# Patient Record
Sex: Male | Born: 1970 | Race: Black or African American | Hispanic: No | Marital: Single | Smoking: Current every day smoker
Health system: Southern US, Community
[De-identification: ages and names within clinical notes are randomized; demographics above are authoritative.]

## PROBLEM LIST (undated history)

## (undated) HISTORY — PX: APPENDECTOMY: SHX54

---

## 1998-11-09 ENCOUNTER — Emergency Department (HOSPITAL_COMMUNITY): Admission: EM | Admit: 1998-11-09 | Discharge: 1998-11-09 | Payer: Self-pay | Admitting: Emergency Medicine

## 1999-11-21 ENCOUNTER — Emergency Department (HOSPITAL_COMMUNITY): Admission: EM | Admit: 1999-11-21 | Discharge: 1999-11-21 | Payer: Self-pay | Admitting: *Deleted

## 2006-10-04 ENCOUNTER — Emergency Department (HOSPITAL_COMMUNITY): Admission: EM | Admit: 2006-10-04 | Discharge: 2006-10-04 | Payer: Self-pay | Admitting: Emergency Medicine

## 2009-04-10 ENCOUNTER — Emergency Department (HOSPITAL_COMMUNITY): Admission: EM | Admit: 2009-04-10 | Discharge: 2009-04-10 | Payer: Self-pay | Admitting: Family Medicine

## 2009-11-24 ENCOUNTER — Emergency Department (HOSPITAL_COMMUNITY): Admission: EM | Admit: 2009-11-24 | Discharge: 2009-11-24 | Payer: Self-pay | Admitting: Emergency Medicine

## 2010-08-03 LAB — GC/CHLAMYDIA PROBE AMP, GENITAL
Chlamydia, DNA Probe: NEGATIVE
GC Probe Amp, Genital: NEGATIVE

## 2010-08-03 LAB — RPR: RPR Ser Ql: NONREACTIVE

## 2011-02-17 LAB — HEPATIC FUNCTION PANEL
Bilirubin, Direct: 0.2
Indirect Bilirubin: 0.6
Total Bilirubin: 0.8
Total Protein: 7.8

## 2011-02-17 LAB — CBC
MCHC: 33.5
MCV: 91.2
RDW: 13.2
WBC: 10.9 — ABNORMAL HIGH

## 2011-02-17 LAB — URINALYSIS, ROUTINE W REFLEX MICROSCOPIC
Hgb urine dipstick: NEGATIVE
Nitrite: NEGATIVE
Specific Gravity, Urine: 1.017
Urobilinogen, UA: 0.2

## 2011-02-17 LAB — DIFFERENTIAL
Basophils Absolute: 0
Basophils Relative: 0
Eosinophils Absolute: 0
Lymphs Abs: 0.6 — ABNORMAL LOW
Neutro Abs: 9.5 — ABNORMAL HIGH

## 2011-02-17 LAB — POCT I-STAT CREATININE: Creatinine, Ser: 1.1

## 2011-02-17 LAB — LIPASE, BLOOD: Lipase: 20

## 2011-02-17 LAB — I-STAT 8, (EC8 V) (CONVERTED LAB)
Bicarbonate: 27.4 — ABNORMAL HIGH
Chloride: 106
Glucose, Bld: 90
HCT: 49
Hemoglobin: 16.7
Operator id: 234501
Potassium: 4
pH, Ven: 7.367 — ABNORMAL HIGH

## 2013-04-10 ENCOUNTER — Ambulatory Visit
Admission: RE | Admit: 2013-04-10 | Discharge: 2013-04-10 | Disposition: A | Discharge status: Home / Self Care | Payer: BC Managed Care – PPO | Source: Ambulatory Visit | Attending: Family Medicine | Admitting: Family Medicine

## 2013-04-10 ENCOUNTER — Other Ambulatory Visit: Payer: Self-pay | Admitting: Family Medicine

## 2013-04-10 DIAGNOSIS — R05 Cough: Secondary | ICD-10-CM

## 2013-06-22 ENCOUNTER — Emergency Department (HOSPITAL_COMMUNITY)
Admission: EM | Admit: 2013-06-22 | Discharge: 2013-06-22 | Disposition: A | Discharge status: Home / Self Care | Payer: BC Managed Care – PPO | Source: Home / Self Care | Attending: Emergency Medicine | Admitting: Emergency Medicine

## 2013-06-22 ENCOUNTER — Emergency Department (INDEPENDENT_AMBULATORY_CARE_PROVIDER_SITE_OTHER): Payer: BC Managed Care – PPO

## 2013-06-22 ENCOUNTER — Encounter (HOSPITAL_COMMUNITY): Payer: Self-pay | Admitting: Emergency Medicine

## 2013-06-22 DIAGNOSIS — R6889 Other general symptoms and signs: Secondary | ICD-10-CM

## 2013-06-22 MED ORDER — OSELTAMIVIR PHOSPHATE 75 MG PO CAPS: 75.0000 mg | ORAL_CAPSULE | Freq: Two times a day (BID) | ORAL | Status: DC

## 2013-06-22 MED ORDER — IBUPROFEN 800 MG PO TABS
800.0000 mg | ORAL_TABLET | Freq: Once | ORAL | Status: AC
  Administered 2013-06-22: 800 mg via ORAL

## 2013-06-22 MED ORDER — IBUPROFEN 800 MG PO TABS
ORAL_TABLET | ORAL | Status: AC
  Filled 2013-06-22: qty 1

## 2013-06-22 NOTE — ED Notes (Signed)
C/o cold sx which started yesterday    States he has chills, headache, head pressure and body ache.   Theraflu was taking for relief.

## 2013-06-22 NOTE — ED Provider Notes (Signed)
CSN: [id number]     Arrival date & time 06/22/13  1657 History   First MD Initiated Contact with Patient 06/22/13 1757     Chief Complaint  Patient presents with  . URI      Patient is a 43 y.o. male presenting with flu symptoms. The history is provided by the patient.  Influenza Presenting symptoms: cough, fatigue, fever, headache, myalgias and rhinorrhea   Presenting symptoms: no diarrhea, no nausea, no shortness of breath, no sore throat and no vomiting   Severity:  Moderate Onset quality:  Sudden Duration:  24 hours Progression:  Worsening Chronicity:  New Relieved by:  Nothing Ineffective treatments:  OTC medications Associated symptoms: chills and nasal congestion   Associated symptoms: no mental status change, no neck stiffness and no witnessed syncope   Onset of symptoms yesterday. No relief w/ OTC Thera-flu.   History reviewed. No pertinent past medical history. No past surgical history on file. No family history on file. History  Substance Use Topics  . Smoking status: Not on file  . Smokeless tobacco: Not on file  . Alcohol Use: Not on file    Review of Systems  Constitutional: Positive for fever, chills and fatigue.  HENT: Positive for congestion and rhinorrhea. Negative for sore throat.   Eyes: Negative.   Respiratory: Positive for cough. Negative for shortness of breath.   Cardiovascular: Negative.   Gastrointestinal: Negative.  Negative for nausea, vomiting and diarrhea.  Endocrine: Negative.   Genitourinary: Negative.   Musculoskeletal: Positive for myalgias. Negative for neck stiffness.  Skin: Negative.   Allergic/Immunologic: Negative.   Neurological: Positive for headaches.  Hematological: Negative.   Psychiatric/Behavioral: Negative.       Allergies  Review of patient's allergies indicates no known allergies.  Home Medications  No current outpatient prescriptions on file. BP 117/79  Pulse 83  Temp(Src) 101.7 F (38.7 C) (Oral)  Resp 18   SpO2 95% Physical Exam  Constitutional: He is oriented to person, place, and time. He appears well-developed and well-nourished. He appears ill.  HENT:  Head: Normocephalic and atraumatic.  Right Ear: Tympanic membrane, external ear and ear canal normal.  Left Ear: Tympanic membrane, external ear and ear canal normal.  Nose: Nose normal. Right sinus exhibits no maxillary sinus tenderness and no frontal sinus tenderness. Left sinus exhibits no maxillary sinus tenderness and no frontal sinus tenderness.  Mouth/Throat: Uvula is midline, oropharynx is clear and moist and mucous membranes are normal.  Eyes: Conjunctivae are normal. Right eye exhibits no discharge. Left eye exhibits no discharge.  Neck: Neck supple.  Cardiovascular: Normal rate and regular rhythm.   Pulmonary/Chest: Effort normal and breath sounds normal.  Musculoskeletal: Normal range of motion.  Neurological: He is alert and oriented to person, place, and time.  Skin: Skin is warm and dry.  Psychiatric: He has a normal mood and affect.    ED Course  Procedures (including critical care time) Labs Review Labs Reviewed - No data to display Imaging Review Dg Chest 2 View  06/22/2013   CLINICAL DATA:  Cough.  Upper respiratory infection.  EXAM: CHEST  2 VIEW  COMPARISON:  04/10/2013  FINDINGS: The heart size and mediastinal contours are within normal limits. Both lungs are clear. Pulmonary hyperinflation again noted. The visualized skeletal structures are unremarkable.  IMPRESSION: Stable pulmonary hyperinflation.  No active disease.   Electronically Signed   By: Myles RosenthalJohn  Stahl M.D.   On: 06/22/2013 20:01      MDM   Final  diagnoses:  None   Onset of febrile illness yesterday c/w flu-like illness. CXR neg. No N/V/D, CP or SOB. Will treat w/ Tamiflu. Home care instructions provided.     Roma Kayser May Ozment, NP 06/25/13 [phone]

## 2013-06-22 NOTE — Discharge Instructions (Signed)
°  Rest, drink plenty of liquids and take medication as directed.  Influenza, Adult Influenza ("the flu") is a viral infection of the respiratory tract. It occurs more often in winter months because people spend more time in close contact with one another. Influenza can make you feel very sick. Influenza easily spreads from person to person (contagious). CAUSES  Influenza is caused by a virus that infects the respiratory tract. You can catch the virus by breathing in droplets from an infected person's cough or sneeze. You can also catch the virus by touching something that was recently contaminated with the virus and then touching your mouth, nose, or eyes. SYMPTOMS  Symptoms typically last 4 to 10 days and may include:  Fever.  Chills.  Headache, body aches, and muscle aches.  Sore throat.  Chest discomfort and cough.  Poor appetite.  Weakness or feeling tired.  Dizziness.  Nausea or vomiting. DIAGNOSIS  Diagnosis of influenza is often made based on your history and a physical exam. A nose or throat swab test can be done to confirm the diagnosis. RISKS AND COMPLICATIONS You may be at risk for a more severe case of influenza if you smoke cigarettes, have diabetes, have chronic heart disease (such as heart failure) or lung disease (such as asthma), or if you have a weakened immune system. Elderly people and pregnant women are also at risk for more serious infections. The most common complication of influenza is a lung infection (pneumonia). Sometimes, this complication can require emergency medical care and may be life-threatening. PREVENTION  An annual influenza vaccination (flu shot) is the best way to avoid getting influenza. An annual flu shot is now routinely recommended for all adults in the [place] TREATMENT  In mild cases, influenza goes away on its own. Treatment is directed at relieving symptoms. For more severe cases, your caregiver may prescribe antiviral medicines to shorten  the sickness. Antibiotic medicines are not effective, because the infection is caused by a virus, not by bacteria. HOME CARE INSTRUCTIONS  Only take over-the-counter or prescription medicines for pain, discomfort, or fever as directed by your caregiver.  Use a cool mist humidifier to make breathing easier.  Get plenty of rest until your temperature returns to normal. This usually takes 3 to 4 days.  Drink enough fluids to keep your urine clear or pale yellow.  Cover your mouth and nose when coughing or sneezing, and wash your hands well to avoid spreading the virus.  Stay home from work or school until your fever has been gone for at least 1 full day. SEEK MEDICAL CARE IF:   You have chest pain or a deep cough that worsens or produces more mucus.  You have nausea, vomiting, or diarrhea. SEEK IMMEDIATE MEDICAL CARE IF:   You have difficulty breathing, shortness of breath, or your skin or nails turn bluish.  You have severe neck pain or stiffness.  You have a severe headache, facial pain, or earache.  You have a worsening or recurring fever.  You have nausea or vomiting that cannot be controlled. MAKE SURE YOU:  Understand these instructions.  Will watch your condition.  Will get help right away if you are not doing well or get worse. Document Released: 04/15/2000 Document Revised: 10/18/2011 Document Reviewed: 07/18/2011 [facility] Patient Information 2014 [place], [place].

## 2013-06-25 NOTE — ED Provider Notes (Signed)
Medical screening examination/treatment/procedure(s) were performed by non-physician practitioner and as supervising physician I was immediately available for consultation/collaboration.  Leslee Homeavid Shahab Polhamus, M.D.  Reuben Likesavid C Brolin Dambrosia, MD 06/25/13 [phone]

## 2013-12-09 ENCOUNTER — Emergency Department (HOSPITAL_COMMUNITY)
Admission: EM | Admit: 2013-12-09 | Discharge: 2013-12-09 | Disposition: A | Discharge status: Home / Self Care | Payer: BC Managed Care – PPO | Attending: Emergency Medicine | Admitting: Emergency Medicine

## 2013-12-09 ENCOUNTER — Emergency Department (HOSPITAL_COMMUNITY): Payer: BC Managed Care – PPO

## 2013-12-09 ENCOUNTER — Encounter (HOSPITAL_COMMUNITY): Payer: Self-pay | Admitting: Emergency Medicine

## 2013-12-09 DIAGNOSIS — S99929A Unspecified injury of unspecified foot, initial encounter: Principal | ICD-10-CM

## 2013-12-09 DIAGNOSIS — S8990XA Unspecified injury of unspecified lower leg, initial encounter: Secondary | ICD-10-CM | POA: Diagnosis not present

## 2013-12-09 DIAGNOSIS — Y9389 Activity, other specified: Secondary | ICD-10-CM | POA: Diagnosis not present

## 2013-12-09 DIAGNOSIS — S99919A Unspecified injury of unspecified ankle, initial encounter: Principal | ICD-10-CM

## 2013-12-09 DIAGNOSIS — W010XXA Fall on same level from slipping, tripping and stumbling without subsequent striking against object, initial encounter: Secondary | ICD-10-CM | POA: Insufficient documentation

## 2013-12-09 DIAGNOSIS — F17200 Nicotine dependence, unspecified, uncomplicated: Secondary | ICD-10-CM | POA: Diagnosis not present

## 2013-12-09 DIAGNOSIS — S99922A Unspecified injury of left foot, initial encounter: Secondary | ICD-10-CM

## 2013-12-09 DIAGNOSIS — Y9269 Other specified industrial and construction area as the place of occurrence of the external cause: Secondary | ICD-10-CM | POA: Diagnosis not present

## 2013-12-09 DIAGNOSIS — Z79899 Other long term (current) drug therapy: Secondary | ICD-10-CM | POA: Insufficient documentation

## 2013-12-09 MED ORDER — NAPROXEN 500 MG PO TABS: 500.0000 mg | ORAL_TABLET | Freq: Two times a day (BID) | ORAL | Status: DC

## 2013-12-09 NOTE — Discharge Instructions (Signed)
Take the prescribed medication as directed.  Recommend icing and elevating your foot at home to help with pain/swelling. Follow-up with your primary care physician. Return to the ED for new or worsening symptoms.

## 2013-12-09 NOTE — ED Notes (Signed)
Pt reports slipping at work on ice in a cooler and fell. Pt has pain 10/10 to left foot. Pt has difficulty ambulating. Pain increases with weight bearing.

## 2013-12-09 NOTE — ED Provider Notes (Signed)
CSN: [id number]     Arrival date & time 12/09/13  1450 History  This chart was scribed for non-physician practitioner, Sharilyn Sites, PA-C,working with Toy Baker, MD, by Karle Plumber, ED Scribe. This patient was seen in room WTR6/WTR6 and the patient's care was started at 3:44 PM.  Chief Complaint  Patient presents with  . Foot Pain   Patient is a 43 y.o. male presenting with lower extremity pain. The history is provided by the patient. No language interpreter was used.  Foot Pain   HPI Comments:  Lance Pugh is a 43 y.o. male brought in by EMS, who presents to the Emergency Department complaining of severe left foot pain secondary to slipping and falling on ice in a cooler at work earlier today. He states he fell on top of the foot. He reports associated swelling of the toes but denies pain of the toes. Walking makes the pain worse. He denies numbness, tingling or weakness of the foot. He denies LOC, head injury, knee or hip pain. PCP is Dr. Abigail Miyamoto.   History reviewed. No pertinent past medical history. Past Surgical History  Procedure Laterality Date  . Appendectomy     No family history on file. History  Substance Use Topics  . Smoking status: Current Every Day Smoker  . Smokeless tobacco: Not on file  . Alcohol Use: Yes    Review of Systems  Musculoskeletal: Positive for arthralgias.  All other systems reviewed and are negative.   Allergies  Review of patient's allergies indicates no known allergies.  Home Medications   Prior to Admission medications   Medication Sig Start Date End Date Taking? Authorizing Provider  Tetrahydrozoline HCl (VISINE EXTRA OP) Apply 1 drop to eye every morning.   Yes Historical Provider, MD   Triage Vitals: BP 151/96  Pulse 71  Temp(Src) 98.9 F (37.2 C) (Oral)  Resp 16  SpO2 100% Physical Exam  Nursing note and vitals reviewed. Constitutional: He is oriented to person, place, and time. He appears well-developed and  well-nourished.  HENT:  Head: Normocephalic and atraumatic.  Mouth/Throat: Oropharynx is clear and moist.  Eyes: Conjunctivae and EOM are normal. Pupils are equal, round, and reactive to light.  Neck: Normal range of motion.  Cardiovascular: Normal rate, regular rhythm and normal heart sounds.   Pulmonary/Chest: Effort normal and breath sounds normal.  Abdominal: Soft. Bowel sounds are normal.  Musculoskeletal: Normal range of motion. He exhibits tenderness. He exhibits no edema.       Left foot: He exhibits tenderness and bony tenderness. He exhibits no swelling and no deformity.  Left foot TTP along dorsal aspect without bony deformities; full ROM of ankle; foot remains NVI  Neurological: He is alert and oriented to person, place, and time.  Skin: Skin is warm and dry.  Psychiatric: He has a normal mood and affect.    ED Course  Procedures (including critical care time) DIAGNOSTIC STUDIES: Oxygen Saturation is 100% on RA, normal by my interpretation.   COORDINATION OF CARE: 3:45 PM- Advised pt to RICE area. Will order post op shoe and prescribe pain medication. Pt verbalizes understanding and agrees to plan.  Medications - No data to display  Labs Review Labs Reviewed - No data to display  Imaging Review Dg Foot Complete Left  12/09/2013   CLINICAL DATA:  Left foot pain  EXAM: LEFT FOOT - COMPLETE 3+ VIEW  COMPARISON:  None.  FINDINGS: Hallux valgus deformity is noted. No acute fracture or dislocation is seen.  No gross soft tissue abnormality is noted.  IMPRESSION: No acute abnormality seen.   Electronically Signed   By: Alcide CleverMark  Lukens M.D.   On: 12/09/2013 15:34     EKG Interpretation None      MDM   Final diagnoses:  Foot injury, left, initial encounter   Imaging negative for acute fx.  Foot remains NVI.  Pt states regular shoe is uncomfortable to wear at this time.  Have wrapped foot and placed in post-op shoe.  Rx naprosyn.  Will FU with PCP.  Discussed plan with  patient, he/she acknowledged understanding and agreed with plan of care.  Return precautions given for new or worsening symptoms.  I personally performed the services described in this documentation, which was scribed in my presence. The recorded information has been reviewed and is accurate.  Garlon HatchetLisa M Kord Monette, PA-C 12/09/13 1606

## 2013-12-09 NOTE — ED Notes (Signed)
Per EMS, patient fell this morning at work. Now states he is having 10/10 left foot pain. Pt did not hit head when he fell; No loss of consciousness. Conscious, Alert, and Oriented.  No complaints of neck or back pain. VS stable BP" 160/70, P 78, RR 18.  Pt is not on blood thinners.

## 2013-12-11 NOTE — ED Provider Notes (Signed)
Medical screening examination/treatment/procedure(s) were performed by non-physician practitioner and as supervising physician I was immediately available for consultation/collaboration.  Kenzly Rogoff T Estes Lehner, MD 12/11/13 0858 

## 2018-02-09 ENCOUNTER — Ambulatory Visit (HOSPITAL_COMMUNITY)
Admission: EM | Admit: 2018-02-09 | Discharge: 2018-02-09 | Disposition: A | Discharge status: Home / Self Care | Payer: Self-pay | Attending: Family Medicine | Admitting: Family Medicine

## 2018-02-09 ENCOUNTER — Encounter (HOSPITAL_COMMUNITY): Payer: Self-pay | Admitting: Emergency Medicine

## 2018-02-09 DIAGNOSIS — Z79899 Other long term (current) drug therapy: Secondary | ICD-10-CM | POA: Insufficient documentation

## 2018-02-09 DIAGNOSIS — J029 Acute pharyngitis, unspecified: Secondary | ICD-10-CM

## 2018-02-09 DIAGNOSIS — F17200 Nicotine dependence, unspecified, uncomplicated: Secondary | ICD-10-CM | POA: Insufficient documentation

## 2018-02-09 DIAGNOSIS — K219 Gastro-esophageal reflux disease without esophagitis: Secondary | ICD-10-CM

## 2018-02-09 LAB — POCT RAPID STREP A: Streptococcus, Group A Screen (Direct): NEGATIVE

## 2018-02-09 MED ORDER — RANITIDINE HCL 150 MG PO CAPS: 150.0000 mg | ORAL_CAPSULE | Freq: Every day | ORAL | 0 refills | Status: AC

## 2018-02-09 MED ORDER — OMEPRAZOLE 20 MG PO CPDR: 20.0000 mg | DELAYED_RELEASE_CAPSULE | Freq: Every day | ORAL | 1 refills | Status: AC

## 2018-02-09 MED ORDER — GI COCKTAIL ~~LOC~~
30.0000 mL | Freq: Once | ORAL | Status: AC
  Administered 2018-02-09: 30 mL via ORAL

## 2018-02-09 MED ORDER — GI COCKTAIL ~~LOC~~
ORAL | Status: AC
  Filled 2018-02-09: qty 30

## 2018-02-09 NOTE — ED Provider Notes (Signed)
[facility]    CSN: [id number] Arrival date & time: 02/09/18  5784     History   Chief Complaint Chief Complaint  Patient presents with  . Sore Throat    HPI Marvelous Bouwens is a 47 y.o. male.   he is a 47 year old male with past medical history of GERD.  He presents with 4 days of throat discomfort and epigastric discomfort with burning.  His symptoms have been constant and remained  the same.  He reports sore throat when swallowing. No trouble swallowing or breathing. His symptoms are worse in the am and after eating. Mild cough at times, unproductive.   He typically takes Prevacid for his GERD symptoms which helps.  He denies any associated nausea, vomiting, diarrhea.  He reports a possible low-grade fever on Monday.  He denies any associated URI symptoms.  He denies any chest pain, shortness of breath, palpitations.  ROS per HPI    Sore Throat     History reviewed. No pertinent past medical history.  There are no active problems to display for this patient.   Past Surgical History:  Procedure Laterality Date  . APPENDECTOMY         Home Medications    Prior to Admission medications   Medication Sig Start Date End Date Taking? Authorizing Provider  naproxen (NAPROSYN) 500 MG tablet Take 1 tablet (500 mg total) by mouth 2 (two) times daily with a meal. 12/09/13   Garlon Hatchet, PA-C  omeprazole (PRILOSEC) 20 MG capsule Take 1 capsule (20 mg total) by mouth daily. 02/09/18   Dahlia Byes A, NP  ranitidine (ZANTAC) 150 MG capsule Take 1 capsule (150 mg total) by mouth daily. 02/09/18   Dahlia Byes A, NP  Tetrahydrozoline HCl (VISINE EXTRA OP) Apply 1 drop to eye every morning.    [provider]    Family History History reviewed. No pertinent family history.  Social History Social History   Tobacco Use  . Smoking status: Current Every Day Smoker  . Smokeless tobacco: Never Used  Substance Use Topics  . Alcohol use: Yes  . Drug use:  Not on file     Allergies   Patient has no known allergies.   Review of Systems Review of Systems   Physical Exam Triage Vital Signs ED Triage Vitals  Enc Vitals Group     BP 02/09/18 1001 (!) 133/93     Pulse Rate 02/09/18 1001 65     Resp 02/09/18 1001 16     Temp 02/09/18 1001 98.6 F (37 C)     Temp Source 02/09/18 1001 Oral     SpO2 02/09/18 1001 100 %     Weight --      Height --      Head Circumference --      Peak Flow --      Pain Score 02/09/18 1015 7     Pain Loc --      Pain Edu? --      Excl. in GC? --    No data found.  Updated Vital Signs BP (!) 133/93 (BP Location: Left Arm)   Pulse 65   Temp 98.6 F (37 C) (Oral)   Resp 16   SpO2 100%   Visual Acuity Right Eye Distance:   Left Eye Distance:   Bilateral Distance:    Right Eye Near:   Left Eye Near:    Bilateral Near:     Physical Exam  Constitutional: He appears  well-developed and well-nourished.  Very pleasant. Non toxic or ill appearing.   HENT:  Head: Normocephalic and atraumatic.  Mouth/Throat: Uvula is midline, oropharynx is clear and moist and mucous membranes are normal.  Bilateral TMs normal.  External ears normal.  Mild  posterior oropharyngeal erythema, without tonsillar swelling or exudates. No lesions.  Mild tenderness to palpation of right and left lateral neck area.  No lymphadenopathy.   Cardiovascular: Normal rate, regular rhythm and normal heart sounds.  Pulmonary/Chest: Effort normal and breath sounds normal.  Lungs clear in all fields. No dyspnea or distress. No retractions or nasal flaring.   Abdominal: Soft. Bowel sounds are normal.  Moderate tenderness mid epigastric area.  Nontender to the rest of entire abdomen, no masses, rebound tenderness.  Neurological: He is alert.  Skin: Skin is warm and dry. No rash noted. He is not diaphoretic. No erythema. No pallor.  Psychiatric: He has a normal mood and affect.  Nursing note and vitals reviewed.    UC  Treatments / Results  Labs (all labs ordered are listed, but only abnormal results are displayed) Labs Reviewed  CULTURE, GROUP A STREP [facility])  POCT RAPID STREP A    EKG None  Radiology No results found.  Procedures Procedures (including critical care time)  Medications Ordered in UC Medications  gi cocktail (Maalox,Lidocaine,Donnatal) (30 mLs Oral Given 02/09/18 1048)    Initial Impression / Assessment and Plan / UC Course  I have reviewed the triage vital signs and the nursing notes.  Pertinent labs & imaging results that were available during my care of the patient were reviewed by me and considered in my medical decision making (see chart for details).    Rapid strep test negative. Most likely sore throat and epigastric pain is associated with his GERD.  We will give GI cocktail in clinic to see if this helps We will try a new regimen of omeprazole daily and ranitidine at night for a few weeks to see if this helps If his symptoms do not get any better or worsen over the next couple weeks he went to follow-up with GI. We could also try Zyrtec daily to see if this helps as it could also be allergy related.  Gi cocktail gave some relief.  Final Clinical Impressions(s) / UC Diagnoses   Final diagnoses:  Gastroesophageal reflux disease, esophagitis presence not specified     Discharge Instructions     I believe your symptoms are associated with acid reflux.  We are going to try omeprazole 20 mg once daily and zantac 150 mg at night.  Make sure that you take the omeprazole 30- 60 minutes prior to a meal with a glass of water.  Avoid spicy, greasy foods, caffeine, chocolate and milk products.  No eating 2-3 hours before bedtime. Elevate the head of the bed 30 degrees.  Try this for a few weeks to see if this improves your symptoms.  If you don't see any improvement or your symptoms worsen please follow up with a GI      ED Prescriptions    Medication Sig Dispense  Auth. Provider   omeprazole (PRILOSEC) 20 MG capsule Take 1 capsule (20 mg total) by mouth daily. 30 capsule Reika Callanan A, NP   ranitidine (ZANTAC) 150 MG capsule Take 1 capsule (150 mg total) by mouth daily. 30 capsule Dahlia Byes A, NP     Controlled Substance Prescriptions [place] Controlled Substance Registry consulted? Not Applicable   Janace Aris, NP 02/09/18  1123  

## 2018-02-09 NOTE — ED Triage Notes (Signed)
C/o sore throat onset 4 days... Sx include dysphagia, vomiting, abd pain  Fever has subsided  A&O x4... NAD...ambulatory

## 2018-02-09 NOTE — Discharge Instructions (Signed)
I believe your symptoms are associated with acid reflux.  °We are going to try omeprazole 20 mg once daily and zantac 150 mg at night.  °Make sure that you take the omeprazole 30- 60 minutes prior to a meal with a glass of water.  °Avoid spicy, greasy foods, caffeine, chocolate and milk products.  °No eating 2-3 hours before bedtime. Elevate the head of the bed 30 degrees.  °Try this for a few weeks to see if this improves your symptoms.  °If you don't see any improvement or your symptoms worsen please follow up with a GI   °

## 2018-02-11 LAB — CULTURE, GROUP A STREP (THRC)

## 2018-06-21 ENCOUNTER — Emergency Department (HOSPITAL_COMMUNITY)
Admission: EM | Admit: 2018-06-21 | Discharge: 2018-06-21 | Disposition: A | Discharge status: Home / Self Care | Payer: Self-pay | Attending: Emergency Medicine | Admitting: Emergency Medicine

## 2018-06-21 ENCOUNTER — Other Ambulatory Visit: Payer: Self-pay

## 2018-06-21 ENCOUNTER — Emergency Department (HOSPITAL_COMMUNITY): Payer: Self-pay

## 2018-06-21 ENCOUNTER — Encounter (HOSPITAL_COMMUNITY): Payer: Self-pay | Admitting: Emergency Medicine

## 2018-06-21 DIAGNOSIS — Y92310 Basketball court as the place of occurrence of the external cause: Secondary | ICD-10-CM | POA: Insufficient documentation

## 2018-06-21 DIAGNOSIS — Z79899 Other long term (current) drug therapy: Secondary | ICD-10-CM | POA: Insufficient documentation

## 2018-06-21 DIAGNOSIS — Y9367 Activity, basketball: Secondary | ICD-10-CM | POA: Insufficient documentation

## 2018-06-21 DIAGNOSIS — S99912A Unspecified injury of left ankle, initial encounter: Secondary | ICD-10-CM

## 2018-06-21 DIAGNOSIS — M25572 Pain in left ankle and joints of left foot: Secondary | ICD-10-CM | POA: Insufficient documentation

## 2018-06-21 DIAGNOSIS — W1840XA Slipping, tripping and stumbling without falling, unspecified, initial encounter: Secondary | ICD-10-CM | POA: Insufficient documentation

## 2018-06-21 DIAGNOSIS — F17200 Nicotine dependence, unspecified, uncomplicated: Secondary | ICD-10-CM | POA: Insufficient documentation

## 2018-06-21 DIAGNOSIS — Y999 Unspecified external cause status: Secondary | ICD-10-CM | POA: Insufficient documentation

## 2018-06-21 MED ORDER — HYDROCODONE-ACETAMINOPHEN 5-325 MG PO TABS: 1.0000 | ORAL_TABLET | ORAL | 0 refills | Status: DC | PRN

## 2018-06-21 NOTE — ED Triage Notes (Signed)
Patient presents with left ankle sprain after being in the gym last night. Patient states he woke up this morning and was unable to bear weight.

## 2018-06-21 NOTE — ED Provider Notes (Signed)
Care handoff received from Sharilyn Sites, PA-C at shift change.  Please see her note for full details.  In short 48 year old male here for left ankle pain.  Imaging read by radiologist as negative however concern by previous team for fracture.  Plan per previous team is to have patient placed in splint given crutches and Ortho follow-up today.  AVS and referrals already completed by previous team awaiting [facility] for splint at this time then discharge. Physical Exam  BP (!) 155/80 (BP Location: Left Arm)   Pulse 66   Temp 98.6 F (37 C) (Oral)   Resp 16   SpO2 100%   Physical Exam Constitutional:      General: He is not in acute distress.    Appearance: Normal appearance. He is not ill-appearing or diaphoretic.  HENT:     Head: Normocephalic and atraumatic.     Nose: Nose normal.  Eyes:     General: Vision grossly intact. Gaze aligned appropriately.  Neck:     Musculoskeletal: Normal range of motion.     Trachea: Trachea and phonation normal. No tracheal deviation.  Cardiovascular:     Rate and Rhythm: Normal rate and regular rhythm.     Pulses:          Dorsalis pedis pulses are 2+ on the right side and 2+ on the left side.       Posterior tibial pulses are 2+ on the right side and 2+ on the left side.  Pulmonary:     Effort: Pulmonary effort is normal. No respiratory distress.     Breath sounds: Normal air entry.  Musculoskeletal:     Right lower leg: Normal. He exhibits no tenderness and no swelling.     Left lower leg: Normal. He exhibits no tenderness and no swelling.  Feet:     Right foot:     Protective Sensation: 3 sites tested. 3 sites sensed.     Left foot:     Protective Sensation: 3 sites tested. 3 sites sensed.  Neurological:     Mental Status: He is alert.     GCS: GCS eye subscore is 4. GCS verbal subscore is 5. GCS motor subscore is 6.     Comments: Speech is clear and goal oriented, follows commands Major Cranial nerves without deficit, no facial  droop Sensation normal to light touch Moves extremities without ataxia, coordination intact    ED Course/Procedures     Procedures  MDM  6:45 AM: Patient is resting comfortably in no acute distress.  Left lower extremity is neurovascular intact.  Pedal pulses equal bilaterally.  Capillary refill and sensation intact.  Movement is intact with some pain.  Compartments soft. Patient states understanding of care plan by previous team and plans to call orthopedics today for evaluation. Care plan rediscussed with Dr. Bebe Shaggy who agrees with plan. - 6:55 AM: [facility] has arrived for splint placement. -  7:20 AM: Patient reassessed after splint placed.  Patient neurovascularly intact.  Patient states that splint is comfortable.  Capillary refill, sensation intact to toes.  Motion intact toes.  Patient states satisfaction with care given today.  Patient has been discharged in good condition. - At this time there does not appear to be any evidence of an acute emergency medical condition and the patient appears stable for discharge with appropriate outpatient follow up. Diagnosis was discussed with patient who verbalizes understanding of care plan and is agreeable to discharge. I have discussed return precautions with patient who verbalizes  understanding of return precautions. Patient strongly encouraged to follow-up with their PCP and ortho. All questions answered.  Note: Portions of this report may have been transcribed using voice recognition software. Every effort was made to ensure accuracy; however, inadvertent computerized transcription errors may still be present.   Elizabeth Palau 06/21/18 0277    Zadie Rhine, MD 06/26/18 2306

## 2018-06-21 NOTE — ED Provider Notes (Signed)
[facility]-EMERGENCY DEPT Provider Note   CSN: [id number] Arrival date & time: 06/21/18  0436    History   Chief Complaint Chief Complaint  Patient presents with  . Ankle Pain    HPI Fulton Pita is a 48 y.o. male.     The history is provided by the patient and medical records.     48 y.o. M here with left ankle injury.  States he was playing basketball last night at the gym and rolled his ankle when a small kid ran out in front of him.  No falls or direct trauma.  States he soaked the ankle in warm water/eposom salt and then applied ice and felt ok but worse this morning upon waking.  Not able to bear any weight on left foot/ankle at this time.  No prior injuries/surgeries to left ankle.  No meds PTA.  History reviewed. No pertinent past medical history.  There are no active problems to display for this patient.   Past Surgical History:  Procedure Laterality Date  . APPENDECTOMY          Home Medications    Prior to Admission medications   Medication Sig Start Date End Date Taking? Authorizing Provider  naproxen (NAPROSYN) 500 MG tablet Take 1 tablet (500 mg total) by mouth 2 (two) times daily with a meal. 12/09/13   Garlon Hatchet, PA-C  omeprazole (PRILOSEC) 20 MG capsule Take 1 capsule (20 mg total) by mouth daily. 02/09/18   Dahlia Byes A, NP  ranitidine (ZANTAC) 150 MG capsule Take 1 capsule (150 mg total) by mouth daily. 02/09/18   Dahlia Byes A, NP  Tetrahydrozoline HCl (VISINE EXTRA OP) Apply 1 drop to eye every morning.    [provider]    Family History No family history on file.  Social History Social History   Tobacco Use  . Smoking status: Current Every Day Smoker  . Smokeless tobacco: Never Used  Substance Use Topics  . Alcohol use: Yes  . Drug use: Yes    Types: Marijuana     Allergies   Patient has no known allergies.   Review of Systems Review of Systems  Musculoskeletal: Positive for  arthralgias.  All other systems reviewed and are negative.    Physical Exam Updated Vital Signs BP (!) 175/86 (BP Location: Left Arm)   Pulse 60   Temp 98.6 F (37 C) (Oral)   Resp 18   SpO2 100%   Physical Exam Vitals signs and nursing note reviewed.  Constitutional:      Appearance: He is well-developed.  HENT:     Head: Normocephalic and atraumatic.  Eyes:     Conjunctiva/sclera: Conjunctivae normal.     Pupils: Pupils are equal, round, and reactive to light.  Neck:     Musculoskeletal: Normal range of motion.  Cardiovascular:     Rate and Rhythm: Normal rate and regular rhythm.     Heart sounds: Normal heart sounds.  Pulmonary:     Effort: Pulmonary effort is normal.     Breath sounds: Normal breath sounds.  Abdominal:     General: Bowel sounds are normal.     Palpations: Abdomen is soft.  Musculoskeletal:     Left ankle: He exhibits decreased range of motion (due to pain) and swelling (mild). He exhibits no deformity and no laceration. Tenderness. AITFL tenderness found. Achilles tendon normal.  Skin:    General: Skin is warm and dry.  Neurological:  Mental Status: He is alert and oriented to person, place, and time.      ED Treatments / Results  Labs (all labs ordered are listed, but only abnormal results are displayed) Labs Reviewed - No data to display  EKG None  Radiology Dg Ankle Complete Left  Result Date: 06/21/2018 CLINICAL DATA:  Basketball injury EXAM: LEFT ANKLE COMPLETE - 3+ VIEW COMPARISON:  None. FINDINGS: There is no evidence of fracture, dislocation, or joint effusion. There is no evidence of arthropathy or other focal bone abnormality. Soft tissues are unremarkable. IMPRESSION: Negative. Electronically Signed   By: Deatra Robinson M.D.   On: 06/21/2018 05:43    Procedures Procedures (including critical care time)  Medications Ordered in ED Medications - No data to display   Initial Impression / Assessment and Plan / ED Course  I  have reviewed the triage vital signs and the nursing notes.  Pertinent labs & imaging results that were available during my care of the patient were reviewed by me and considered in my medical decision making (see chart for details).  48 year old male here with left ankle injury.  Reports he rolled it playing basketball last night when a child ran in front of him.  Was doing okay with supportive care at home last night but upon waking this morning had increased pain and unable to bear weight.  Tenderness along the AITFL and along the bottom of the foot on exam.  Foot is neurovascular intact.    X-ray obtained and read as negative, however on oblique view there does appear to be a avulsion bone fragment present.  This was not present on prior left foot films from 2015.  Patient was rechecked and is tender in this location.  Suspect this is acute given his reported injury.  Will place in short leg splint, give crutches and have him follow-up with orthopedics.  Can return here for any new/acute changes.  Final Clinical Impressions(s) / ED Diagnoses   Final diagnoses:  Injury of left ankle, initial encounter    ED Discharge Orders         Ordered    HYDROcodone-acetaminophen (NORCO/VICODIN) 5-325 MG tablet  Every 4 hours PRN     06/21/18 0558           Garlon Hatchet, PA-C 06/21/18 2979    Zadie Rhine, MD 06/21/18 [phone]

## 2018-06-21 NOTE — ED Notes (Signed)
Pt informed of delay, waiting on ortho tech for splint.

## 2018-06-21 NOTE — Discharge Instructions (Addendum)
Take the prescribed medication as directed. Follow-up with Dr. Roda Shutters-- call his office today to make an appt. Return to the ED for new or worsening symptoms.

## 2018-06-26 ENCOUNTER — Encounter (INDEPENDENT_AMBULATORY_CARE_PROVIDER_SITE_OTHER): Payer: Self-pay | Admitting: Orthopaedic Surgery

## 2018-06-26 ENCOUNTER — Ambulatory Visit (INDEPENDENT_AMBULATORY_CARE_PROVIDER_SITE_OTHER): Payer: Self-pay | Admitting: Orthopaedic Surgery

## 2018-06-26 DIAGNOSIS — S93492A Sprain of other ligament of left ankle, initial encounter: Secondary | ICD-10-CM | POA: Insufficient documentation

## 2018-06-26 MED ORDER — TRAMADOL HCL 50 MG PO TABS: 50.0000 mg | ORAL_TABLET | Freq: Four times a day (QID) | ORAL | 1 refills | Status: DC | PRN

## 2018-06-26 NOTE — Progress Notes (Signed)
   Office Visit Note   Patient: Lance Pugh           Date of Birth: June 15, 1970           MRN: [id number] Visit Date: 06/26/2018              Requested by: No referring provider defined for this encounter. PCP: System, Pcp Not In   Assessment & Plan: Visit Diagnoses:  1. Sprain of anterior talofibular ligament of left ankle, initial encounter     Plan: Impression is left ankle sprain.  We will transition the patient into a cam walker weightbearing as tolerated.  He will ice and elevate for pain and swelling.  I will call in tramadol to take as needed.  He will follow-up with [place] in 3 weeks time for repeat evaluation.  Follow-Up Instructions: Return in about 3 weeks (around 07/17/2018).   Orders:  No orders of the defined types were placed in this encounter.  Meds ordered this encounter  Medications  . traMADol (ULTRAM) 50 MG tablet    Sig: Take 1 tablet (50 mg total) by mouth every 6 (six) hours as needed.    Dispense:  30 tablet    Refill:  1      Procedures: No procedures performed   Clinical Data: No additional findings.   Subjective: Chief Complaint  Patient presents with  . Left Ankle - Pain    HPI patient is a pleasant 48 year old gentleman who presents our clinic today following an injury to his left ankle.  He is playing basketball at the gym on 05/20/2018, when he rolled his left ankle.  He was seen in the ED the following day.  X-rays were obtained which were negative for fracture.  He is complaining of pain to the ATFL.  Worse with bearing weight.  He has been in a splint since last week.  He has been elevating for swelling.  Review of Systems as detailed in HPI.  All others reviewed and are negative.   Objective: Vital Signs: There were no vitals taken for this visit.  Physical Exam well-developed and well-nourished gentleman in no acute distress.  Alert and oriented x3.  Ortho Exam examination of his left ankle shows minimal swelling.  No bony  tenderness.  Moderate tenderness over the ATFL.  Increased pain with eversion and dorsiflexion.  He is neurovascularly intact distally.  Specialty Comments:  No specialty comments available.  Imaging: X-rays reviewed by me in canopy of the left ankle/foot.  These reveal an Allis perineum.  No acute fracture noted.   PMFS History: Patient Active Problem List   Diagnosis Date Noted  . Sprain of anterior talofibular ligament of left ankle 06/26/2018   History reviewed. No pertinent past medical history.  History reviewed. No pertinent family history.  Past Surgical History:  Procedure Laterality Date  . APPENDECTOMY     Social History   Occupational History  . Not on file  Tobacco Use  . Smoking status: Current Every Day Smoker  . Smokeless tobacco: Never Used  Substance and Sexual Activity  . Alcohol use: Yes  . Drug use: Yes    Types: Marijuana  . Sexual activity: Not on file

## 2018-07-17 ENCOUNTER — Encounter (INDEPENDENT_AMBULATORY_CARE_PROVIDER_SITE_OTHER): Payer: Self-pay | Admitting: Orthopaedic Surgery

## 2018-07-17 ENCOUNTER — Ambulatory Visit (INDEPENDENT_AMBULATORY_CARE_PROVIDER_SITE_OTHER): Payer: Self-pay | Admitting: Orthopaedic Surgery

## 2018-07-17 ENCOUNTER — Other Ambulatory Visit: Payer: Self-pay

## 2018-07-17 DIAGNOSIS — S93492D Sprain of other ligament of left ankle, subsequent encounter: Secondary | ICD-10-CM

## 2018-07-17 NOTE — Progress Notes (Signed)
   Office Visit Note   Patient: Lance Pugh           Date of Birth: 1971/04/08           MRN: [id number] Visit Date: 07/17/2018              Requested by: No referring provider defined for this encounter. PCP: System, Pcp Not In   Assessment & Plan: Visit Diagnoses:  1. Sprain of anterior talofibular ligament of left ankle, subsequent encounter     Plan: Impression is left ankle sprain with improvement.  At this point we will send patient to outpatient physical therapy.  Work note was provided for light duty.  ASO brace was given today.  He can discontinue the cam walker.  Questions encouraged and answered.  Follow-up as needed.  Follow-Up Instructions: Return if symptoms worsen or fail to improve.   Orders:  No orders of the defined types were placed in this encounter.  No orders of the defined types were placed in this encounter.     Procedures: No procedures performed   Clinical Data: No additional findings.   Subjective: Chief Complaint  Patient presents with  . Left Ankle - Pain, Follow-up    Lance Pugh is 3 weeks status post left ankle sprain.  He is doing better today.  Overall he feels that symptoms have improved.  He still has some mild discomfort.  No significant swelling.   Review of Systems  Constitutional: Negative.   All other systems reviewed and are negative.    Objective: Vital Signs: There were no vitals taken for this visit.  Physical Exam Vitals signs and nursing note reviewed.  Constitutional:      Appearance: He is well-developed.  Pulmonary:     Effort: Pulmonary effort is normal.  Abdominal:     Palpations: Abdomen is soft.  Skin:    General: Skin is warm.  Neurological:     Mental Status: He is alert and oriented to person, place, and time.  Psychiatric:        Behavior: Behavior normal.        Thought Content: Thought content normal.        Judgment: Judgment normal.     Ortho Exam Left ankle exam shows no swelling.   There is mild tenderness in the ATFL region. Specialty Comments:  No specialty comments available.  Imaging: No results found.   PMFS History: Patient Active Problem List   Diagnosis Date Noted  . Sprain of anterior talofibular ligament of left ankle 06/26/2018   History reviewed. No pertinent past medical history.  History reviewed. No pertinent family history.  Past Surgical History:  Procedure Laterality Date  . APPENDECTOMY     Social History   Occupational History  . Not on file  Tobacco Use  . Smoking status: Current Every Day Smoker  . Smokeless tobacco: Never Used  Substance and Sexual Activity  . Alcohol use: Yes  . Drug use: Yes    Types: Marijuana  . Sexual activity: Not on file

## 2018-08-02 ENCOUNTER — Telehealth (INDEPENDENT_AMBULATORY_CARE_PROVIDER_SITE_OTHER): Payer: Self-pay

## 2018-08-02 ENCOUNTER — Encounter (INDEPENDENT_AMBULATORY_CARE_PROVIDER_SITE_OTHER): Payer: Self-pay

## 2018-08-02 NOTE — Telephone Encounter (Signed)
See message.

## 2018-08-02 NOTE — Telephone Encounter (Signed)
Note printed and ready for pick up at the front desk. Patient aware.

## 2018-08-02 NOTE — Telephone Encounter (Signed)
yes

## 2018-08-02 NOTE — Telephone Encounter (Signed)
Patient needs note to return to work.  Please call [phone].

## 2018-11-27 ENCOUNTER — Encounter (HOSPITAL_COMMUNITY): Payer: Self-pay

## 2018-11-27 ENCOUNTER — Other Ambulatory Visit: Payer: Self-pay

## 2018-11-27 ENCOUNTER — Ambulatory Visit (HOSPITAL_COMMUNITY)
Admission: EM | Admit: 2018-11-27 | Discharge: 2018-11-27 | Disposition: A | Discharge status: Home / Self Care | Payer: Self-pay | Attending: Family Medicine | Admitting: Family Medicine

## 2018-11-27 DIAGNOSIS — Z202 Contact with and (suspected) exposure to infections with a predominantly sexual mode of transmission: Secondary | ICD-10-CM | POA: Insufficient documentation

## 2018-11-27 MED ORDER — METRONIDAZOLE 500 MG PO TABS: 500.0000 mg | ORAL_TABLET | Freq: Two times a day (BID) | ORAL | 0 refills | Status: DC

## 2018-11-27 NOTE — ED Triage Notes (Signed)
Pt states he needs STD testing.  

## 2018-11-27 NOTE — Discharge Instructions (Signed)
We have sent testing for sexually transmitted infections. We will notify you of any positive results once they are received. If required, we will prescribe any medications you might need.  Please refrain from all sexual activity for at least the next seven days.  

## 2018-11-28 NOTE — ED Provider Notes (Signed)
[place]   [id number] 11/27/18 Arrival Time: 0814  ASSESSMENT & PLAN:  1. Trichomonas contact    Meds ordered this encounter  Medications  . metroNIDAZOLE (FLAGYL) 500 MG tablet    Sig: Take 1 tablet (500 mg total) by mouth 2 (two) times daily.    Dispense:  14 tablet    Refill:  0   Declines empiric gonorrhea/chlamydia tx. Declines HIV/RPR testing.   Discharge Instructions     We have sent testing for sexually transmitted infections. We will notify you of any positive results once they are received. If required, we will prescribe any medications you might need.  Please refrain from all sexual activity for at least the next seven days.     Pending: Labs Reviewed  URINE CYTOLOGY ANCILLARY ONLY    Will notify of any positive results. Instructed to refrain from sexual activity for at least seven days.  Reviewed expectations re: course of current medical issues. Questions answered. Outlined signs and symptoms indicating need for more acute intervention. Patient verbalized understanding. After Visit Summary given.   SUBJECTIVE:  Lance Pugh is a 48 y.o. male who reports that he needs to be treated for trichomoniasis. Girlfriend with symptoms. He reports no symptoms including penile discharge. No h/o this. Urinary symptoms: none. Afebrile. No abdominal or pelvic pain. No n/v. No rashes or lesions. Sexually active with single male partner without regular condom use. OTC treatment: none. History of STI: No.  ROS: As per HPI. All other systems negative.   OBJECTIVE:  Vitals:   11/27/18 1546 11/27/18 1548  BP: (!) 170/110   Pulse: 78   Resp: 18   Temp: 98.8 F (37.1 C)   TempSrc: Oral   SpO2: 100%   Weight:  63 kg     General appearance: alert, cooperative, appears stated age and no distress Throat: lips, mucosa, and tongue normal; teeth and gums normal CV: RRR Lungs: CTAB Back: no CVA tenderness; FROM at waist Abdomen: soft, non-tender  GU: deferred Skin: warm and dry Psychological: alert and cooperative; normal mood and affect.   Labs Reviewed  URINE CYTOLOGY ANCILLARY ONLY    No Known Allergies  PMH: "Occasional stomach problems."  Social History   Socioeconomic History  . Marital status: Single    Spouse name: Not on file  . Number of children: Not on file  . Years of education: Not on file  . Highest education level: Not on file  Occupational History  . Not on file  Social Needs  . Financial resource strain: Not on file  . Food insecurity    Worry: Not on file    Inability: Not on file  . Transportation needs    Medical: Not on file    Non-medical: Not on file  Tobacco Use  . Smoking status: Current Every Day Smoker  . Smokeless tobacco: Never Used  Substance and Sexual Activity  . Alcohol use: Yes  . Drug use: Yes    Types: Marijuana  . Sexual activity: Not on file  Lifestyle  . Physical activity    Days per week: Not on file    Minutes per session: Not on file  . Stress: Not on file  Relationships  . Social Herbalist on phone: Not on file    Gets together: Not on file    Attends religious service: Not on file    Active member of club or organization: Not on file    Attends meetings of clubs  or organizations: Not on file    Relationship status: Not on file  . Intimate partner violence    Fear of current or ex partner: Not on file    Emotionally abused: Not on file    Physically abused: Not on file    Forced sexual activity: Not on file  Other Topics Concern  . Not on file  Social History Narrative  . Not on file          Mardella LaymanHagler, Carrington Olazabal, MD 11/28/18 [phone]

## 2018-11-29 LAB — URINE CYTOLOGY ANCILLARY ONLY
Chlamydia: NEGATIVE
Neisseria Gonorrhea: NEGATIVE
Trichomonas: NEGATIVE

## 2020-07-23 IMAGING — CR DG ANKLE COMPLETE 3+V*L*
3 series · 3 of 3 positions shown · non-contrast
Comparison: None.

CLINICAL DATA: Basketball injury

EXAM:
LEFT ANKLE COMPLETE - 3+ VIEW

[x ankle ap left]
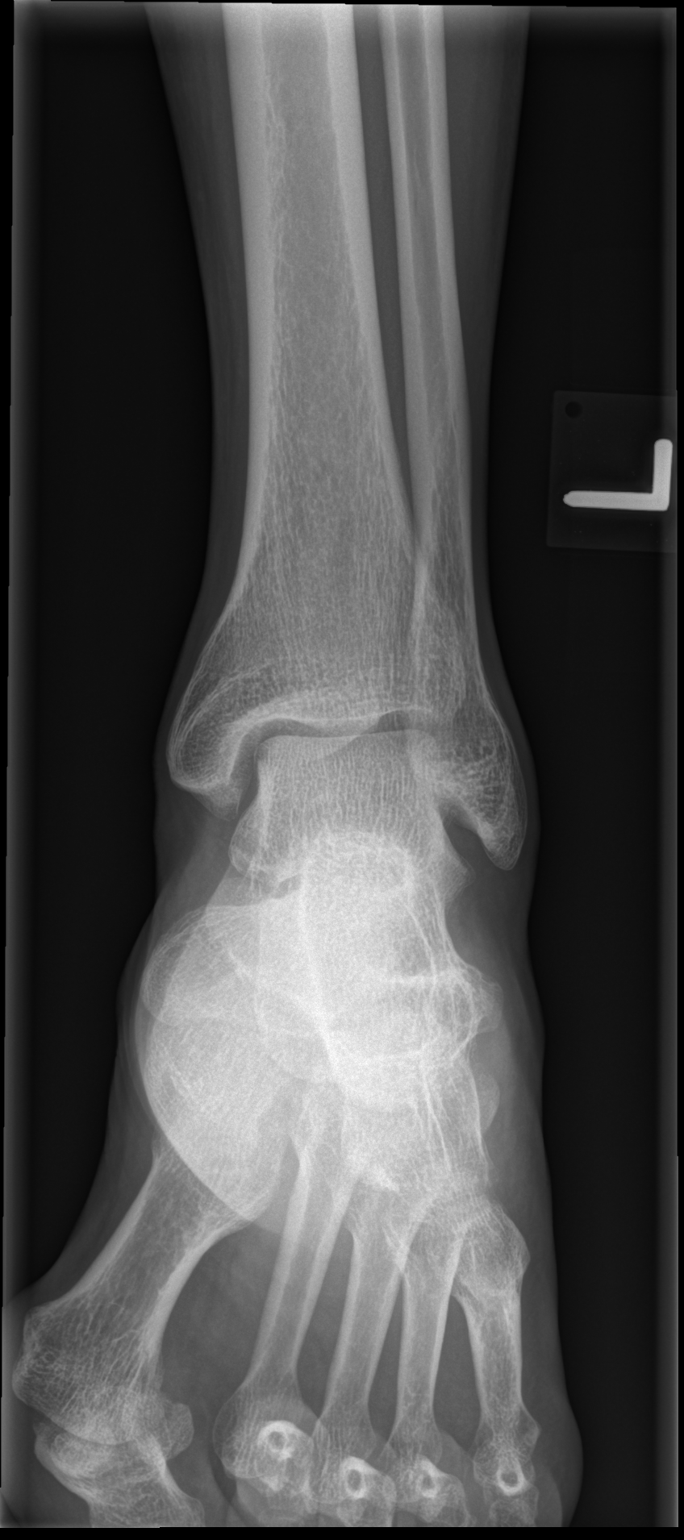

[x ankle obl left]
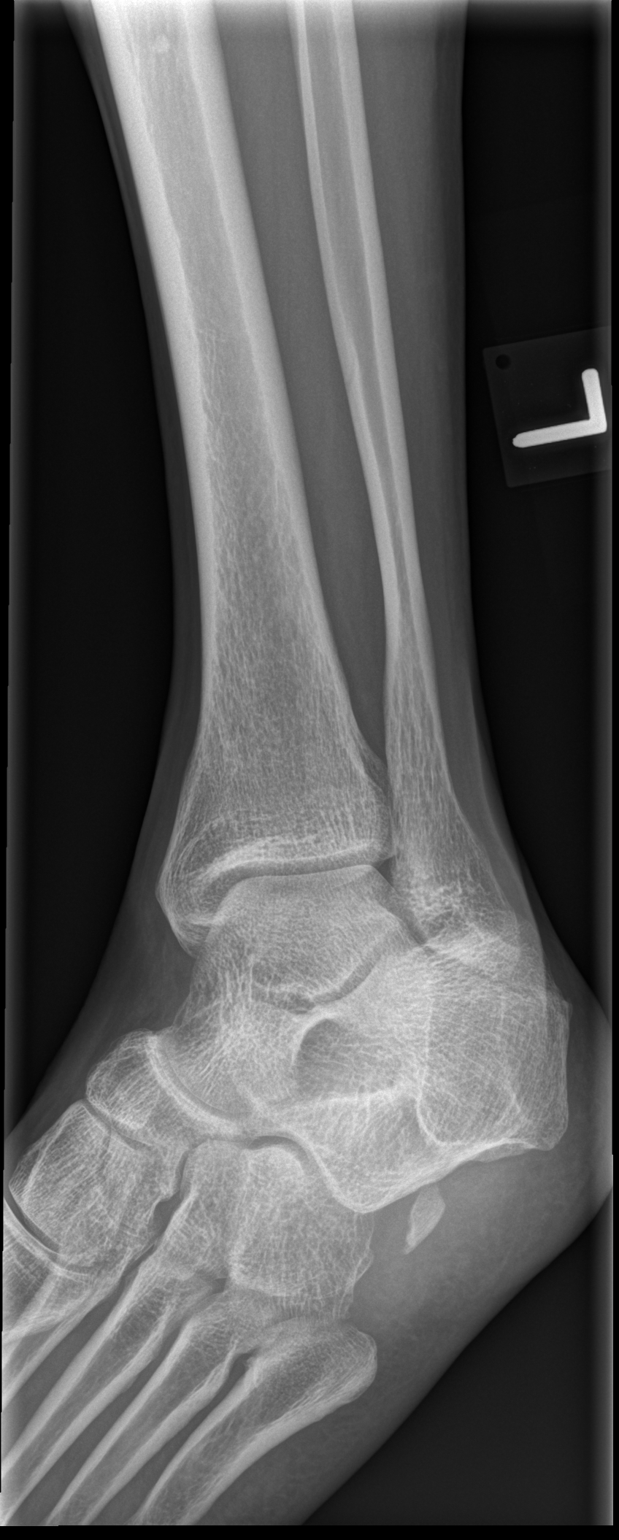

[x ankle lat left]
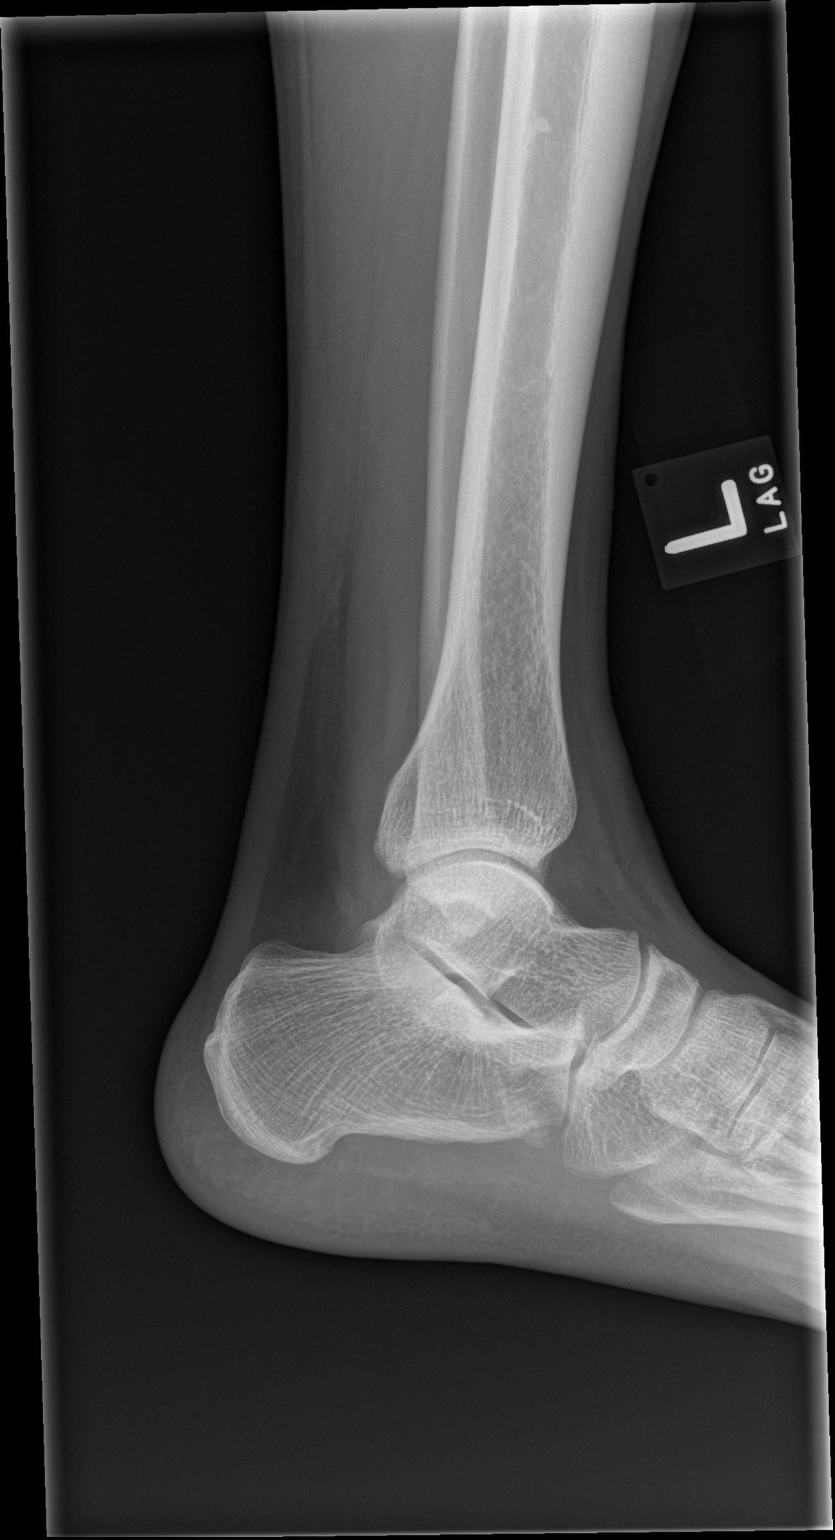

[3 of 3 positions shown; findings below may reference images not displayed]

FINDINGS: There is no evidence of fracture, dislocation, or joint effusion.
There is no evidence of arthropathy or other focal bone abnormality.
Soft tissues are unremarkable.
IMPRESSION: Negative.

## 2022-01-14 ENCOUNTER — Encounter (HOSPITAL_COMMUNITY): Payer: Self-pay

## 2022-01-14 ENCOUNTER — Emergency Department (HOSPITAL_COMMUNITY): Payer: 59

## 2022-01-14 ENCOUNTER — Other Ambulatory Visit: Payer: Self-pay

## 2022-01-14 ENCOUNTER — Emergency Department (HOSPITAL_COMMUNITY)
Admission: EM | Admit: 2022-01-14 | Discharge: 2022-01-14 | Disposition: A | Discharge status: Home / Self Care | Payer: 59 | Attending: Emergency Medicine | Admitting: Emergency Medicine

## 2022-01-14 ENCOUNTER — Telehealth (HOSPITAL_COMMUNITY): Payer: Self-pay | Admitting: Emergency Medicine

## 2022-01-14 DIAGNOSIS — M436 Torticollis: Secondary | ICD-10-CM | POA: Insufficient documentation

## 2022-01-14 DIAGNOSIS — M542 Cervicalgia: Secondary | ICD-10-CM | POA: Diagnosis present

## 2022-01-14 MED ORDER — KETOROLAC TROMETHAMINE 15 MG/ML IJ SOLN
30.0000 mg | Freq: Once | INTRAMUSCULAR | Status: AC
  Administered 2022-01-14: 30 mg via INTRAMUSCULAR
  Filled 2022-01-14: qty 2

## 2022-01-14 MED ORDER — LIDOCAINE 5 % EX PTCH
1.0000 | MEDICATED_PATCH | CUTANEOUS | Status: DC
  Administered 2022-01-14: 1 via TRANSDERMAL
  Filled 2022-01-14: qty 1

## 2022-01-14 MED ORDER — OXYCODONE-ACETAMINOPHEN 5-325 MG PO TABS
1.0000 | ORAL_TABLET | Freq: Once | ORAL | Status: AC
  Administered 2022-01-14: 1 via ORAL
  Filled 2022-01-14: qty 1

## 2022-01-14 MED ORDER — METHOCARBAMOL 500 MG PO TABS: 500.0000 mg | ORAL_TABLET | Freq: Two times a day (BID) | ORAL | 0 refills | Status: AC

## 2022-01-14 MED ORDER — METHOCARBAMOL 500 MG PO TABS: 500.0000 mg | ORAL_TABLET | Freq: Two times a day (BID) | ORAL | 0 refills | Status: DC

## 2022-01-14 MED ORDER — NAPROXEN 500 MG PO TABS: 500.0000 mg | ORAL_TABLET | Freq: Two times a day (BID) | ORAL | 0 refills | Status: DC

## 2022-01-14 MED ORDER — NAPROXEN 500 MG PO TABS: 500.0000 mg | ORAL_TABLET | Freq: Two times a day (BID) | ORAL | 0 refills | Status: AC

## 2022-01-14 NOTE — Discharge Instructions (Addendum)
Alternate ice and heat to areas of injury 3-4 times per day to limit inflammation and spasm.  Avoid strenuous activity and heavy lifting.  We recommend consistent use of naproxen in addition to Robaxin for muscle spasms. We recommend follow-up with a primary care doctor to ensure resolution of symptoms.  Return to the ED for any new or concerning symptoms. 

## 2022-01-14 NOTE — Telephone Encounter (Signed)
Is reportedly not received by pharmacy.  Am resending medications computer.

## 2022-01-14 NOTE — ED Provider Notes (Signed)
[facility]-EMERGENCY DEPT Provider Note   CSN: [id number] Arrival date & time: 01/14/22  0054     History  Chief Complaint  Patient presents with   Torticollis    Lance Pugh is a 51 y.o. male.  51 year old male presents to the emergency department for evaluation of atraumatic neck pain.  Symptoms began 2 weeks ago.  He initially thought that he slept on it wrong.  Had some improvement following use of ice/heat and NSAIDs, but pain has begun to worsen again over the last 48 hours.  Movement of head/neck exacerbates symptoms.  He has not taken any medications for intervention in the past 24 hours.  Denies direct trauma or injury as well as extremity paresthesias, extremity numbness, fevers.  The history is provided by the patient. No language interpreter was used.       Home Medications Prior to Admission medications   Medication Sig Start Date End Date Taking? Authorizing Provider  methocarbamol (ROBAXIN) 500 MG tablet Take 1 tablet (500 mg total) by mouth 2 (two) times daily. 01/14/22   Antony Madura, PA-C  naproxen (NAPROSYN) 500 MG tablet Take 1 tablet (500 mg total) by mouth 2 (two) times daily with a meal. 01/14/22   Antony Madura, PA-C  omeprazole (PRILOSEC) 20 MG capsule Take 1 capsule (20 mg total) by mouth daily. 02/09/18   Dahlia Byes A, NP  ranitidine (ZANTAC) 150 MG capsule Take 1 capsule (150 mg total) by mouth daily. 02/09/18   Dahlia Byes A, NP  Tetrahydrozoline HCl (VISINE EXTRA OP) Apply 1 drop to eye every morning.    [provider]      Allergies    Patient has no known allergies.    Review of Systems   Review of Systems Ten systems reviewed and are negative for acute change, except as noted in the HPI.    Physical Exam Updated Vital Signs BP (!) 143/101   Pulse 74   Temp 98.3 F (36.8 C)   Resp 20   Ht 5\' 7"  (1.702 m)   Wt 58.5 kg   SpO2 100%   BMI 20.20 kg/m   Physical Exam Vitals and nursing note reviewed.   Constitutional:      General: He is not in acute distress.    Appearance: He is well-developed. He is not diaphoretic.  HENT:     Head: Normocephalic and atraumatic.     Mouth/Throat:     Comments: Normal phonation. Tolerating secretions w/o difficulty or drooling. Eyes:     General: No scleral icterus.    Conjunctiva/sclera: Conjunctivae normal.  Neck:     Comments: Slight decreased movement with lateral rotation to the left and neck extension. Neck is supple. No swelling, erythema or edema. No stridor. Pulmonary:     Effort: Pulmonary effort is normal. No respiratory distress.  Musculoskeletal:        General: Normal range of motion.     Cervical back: Tenderness (along the course of the left trapezius) present.  Skin:    General: Skin is warm and dry.     Coloration: Skin is not pale.     Findings: No erythema or rash.  Neurological:     Mental Status: He is alert and oriented to person, place, and time.     Sensory: No sensory deficit (intact and equal).     Coordination: Coordination normal.     Comments: Grip strength 5/5 in bilateral upper extremities.  Preserved strength against resistance in all major  muscle groups bilaterally.  Psychiatric:        Behavior: Behavior normal.     ED Results / Procedures / Treatments   Labs (all labs ordered are listed, but only abnormal results are displayed) Labs Reviewed - No data to display  EKG None  Radiology DG Cervical Spine Complete  Result Date: 01/14/2022 CLINICAL DATA:  Neck pain EXAM: CERVICAL SPINE - COMPLETE 4+ VIEW COMPARISON:  None Available. 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 Left neural foraminal narrowing at C4-5 and C5-6 due to uncovertebral spurring. No neural foraminal narrowing on the right. Normal alignment. Prevertebral soft tissues are normal. No fracture. IMPRESSION: Degenerative changes as above.  No acute bony abnormality. Electronically Signed    By: Charlett Nose M.D.   On: 01/14/2022 01:42    Procedures Procedures    Medications Ordered in ED Medications  lidocaine (LIDODERM) 5 % 1 patch (1 patch Transdermal Patch Applied 01/14/22 0401)  ketorolac (TORADOL) 15 MG/ML injection 30 mg (30 mg Intramuscular Given 01/14/22 0402)  oxyCODONE-acetaminophen (PERCOCET/ROXICET) 5-325 MG per tablet 1 tablet (1 tablet Oral Given 01/14/22 0400)    ED Course/ Medical Decision Making/ A&P Clinical Course as of 01/14/22 0515  Fri Jan 14, 2022  0457 Patient reassessed.  He reports that pain has improved from 10/10 on arrival down to 7/10 currently. [KH]    Clinical Course User Index [KH] Antony Madura, PA-C                           Medical Decision Making Amount and/or Complexity of Data Reviewed Radiology: ordered.  Risk Prescription drug management.   This patient presents to the ED for concern of neck pain, this involves an extensive number of treatment options, and is a complaint that carries with it a high risk of complications and morbidity.  The differential diagnosis includes sprain/strain vs lymphadenitis vs cellulitis vs radiculopathy   Co morbidities that complicate the patient evaluation  None    Additional history obtained:  Additional history obtained from partner at bedside   Imaging Studies ordered:  I ordered imaging studies including Xray C-spine  I independently visualized and interpreted imaging which showed degenerative changes without acute abnormality I agree with the radiologist interpretation   Medicines ordered and prescription drug management:  I ordered medication including Toradol, lidoderm patch, and percocet for pain  Reevaluation of the patient after these medicines showed that the patient improved I have reviewed the patients home medicines and have made adjustments as needed   Test Considered:  CTA C-spine   Reevaluation:  After the interventions noted above, I reevaluated the  patient and found that they have :improved   Social Determinants of Health:  Insured patient Good social support   Dispostion:  After consideration of the diagnostic results and the patients response to treatment, I feel that the patent would benefit from supportive care including ice/heat, NSAIDs and Robaxin. Suspect acute torticollis. Return precautions discussed and provided. Patient discharged in stable condition with no unaddressed concerns.        Final Clinical Impression(s) / ED Diagnoses Final diagnoses:  Torticollis, acute    Rx / DC Orders ED Discharge Orders          Ordered    naproxen (NAPROSYN) 500 MG tablet  2 times daily with meals,   Status:  Discontinued        01/14/22 0451    methocarbamol (  ROBAXIN) 500 MG tablet  2 times daily,   Status:  Discontinued        01/14/22 0451    methocarbamol (ROBAXIN) 500 MG tablet  2 times daily        01/14/22 0503    naproxen (NAPROSYN) 500 MG tablet  2 times daily with meals        01/14/22 0503              Antony Madura, PA-C 01/14/22 4008    Gilda Crease, MD 01/14/22 [phone]

## 2022-01-14 NOTE — ED Triage Notes (Signed)
Ambulatory to ED with c/o neck pain x 2 weeks. States he thought he just slept on it wrong but pain is persistent.
